# Patient Record
Sex: Male | Born: 1965 | Race: Black or African American | Hispanic: No | Marital: Single | Smoking: Current every day smoker
Health system: Southern US, Community
[De-identification: ages and names within clinical notes are randomized; demographics above are authoritative.]

## PROBLEM LIST (undated history)

## (undated) DIAGNOSIS — H269 Unspecified cataract: Secondary | ICD-10-CM

## (undated) DIAGNOSIS — E559 Vitamin D deficiency, unspecified: Secondary | ICD-10-CM

## (undated) DIAGNOSIS — Z8673 Personal history of transient ischemic attack (TIA), and cerebral infarction without residual deficits: Secondary | ICD-10-CM

## (undated) DIAGNOSIS — E785 Hyperlipidemia, unspecified: Secondary | ICD-10-CM

## (undated) DIAGNOSIS — F32A Depression, unspecified: Secondary | ICD-10-CM

## (undated) DIAGNOSIS — G459 Transient cerebral ischemic attack, unspecified: Secondary | ICD-10-CM

## (undated) DIAGNOSIS — I10 Essential (primary) hypertension: Secondary | ICD-10-CM

## (undated) DIAGNOSIS — F4481 Dissociative identity disorder: Secondary | ICD-10-CM

## (undated) DIAGNOSIS — G4730 Sleep apnea, unspecified: Secondary | ICD-10-CM

## (undated) DIAGNOSIS — R1310 Dysphagia, unspecified: Secondary | ICD-10-CM

## (undated) DIAGNOSIS — H10409 Unspecified chronic conjunctivitis, unspecified eye: Secondary | ICD-10-CM

## (undated) DIAGNOSIS — F329 Major depressive disorder, single episode, unspecified: Secondary | ICD-10-CM

## (undated) DIAGNOSIS — E119 Type 2 diabetes mellitus without complications: Secondary | ICD-10-CM

## (undated) DIAGNOSIS — F209 Schizophrenia, unspecified: Secondary | ICD-10-CM

## (undated) HISTORY — DX: Essential (primary) hypertension: I10

## (undated) HISTORY — DX: Dissociative identity disorder: F44.81

## (undated) HISTORY — DX: Vitamin D deficiency, unspecified: E55.9

## (undated) HISTORY — DX: Personal history of transient ischemic attack (TIA), and cerebral infarction without residual deficits: Z86.73

## (undated) HISTORY — DX: Schizophrenia, unspecified: F20.9

## (undated) HISTORY — DX: Sleep apnea, unspecified: G47.30

## (undated) HISTORY — DX: Dysphagia, unspecified: R13.10

## (undated) HISTORY — DX: Type 2 diabetes mellitus without complications: E11.9

## (undated) HISTORY — DX: Unspecified chronic conjunctivitis, unspecified eye: H10.409

## (undated) HISTORY — DX: Hyperlipidemia, unspecified: E78.5

## (undated) HISTORY — DX: Transient cerebral ischemic attack, unspecified: G45.9

## (undated) HISTORY — DX: Depression, unspecified: F32.A

## (undated) HISTORY — DX: Major depressive disorder, single episode, unspecified: F32.9

## (undated) HISTORY — DX: Unspecified cataract: H26.9

---

## 2017-10-06 ENCOUNTER — Encounter: Payer: Self-pay | Admitting: *Deleted

## 2017-10-07 ENCOUNTER — Ambulatory Visit: Payer: Self-pay | Admitting: Cardiology

## 2017-10-07 ENCOUNTER — Encounter: Payer: Self-pay | Admitting: Cardiology

## 2017-10-07 NOTE — Progress Notes (Deleted)
Cardiology Office Note  Date: 10/07/2017   ID: Montey Ebel, DOB 1966-08-20, MRN [id number]  PCP: No primary care provider on file.  Consulting Cardiologist: Nona Dell, MD   No chief complaint on file.   History of Present Illness: Leslie Snow is a 51 y.o. male referred for cardiology consultation by Ms.Coady NP for preprocedure cardiac assessment prior to dental extractions. Limited information was provided, and I do not have access to any old records. He currently resides at [facility] facility.  Past Medical History:  Diagnosis Date  . Cataract   . Chronic conjunctivitis   . Depression   . Dysphagia   . Essential hypertension   . History of stroke    Right hemiplegia  . Hyperlipidemia   . Multiple personality disorder (HCC)   . Schizophrenia (HCC)   . Sleep apnea   . TIA (transient ischemic attack)   . Type 2 diabetes mellitus (HCC)   . Vitamin D deficiency     No past surgical history on file.  Current Outpatient Prescriptions  Medication Sig Dispense Refill  . acetaminophen (TYLENOL) 325 MG tablet Take 650 mg by mouth every 6 (six) hours as needed.    Marland Kitchen atorvastatin (LIPITOR) 20 MG tablet Take 20 mg by mouth daily.    . carvedilol (COREG) 6.25 MG tablet Take 6.25 mg by mouth 2 (two) times daily with a meal.    . clopidogrel (PLAVIX) 75 MG tablet Take 75 mg by mouth daily.    Marland Kitchen HYDROcodone-acetaminophen (NORCO/VICODIN) 5-325 MG tablet Take 1 tablet by mouth every 6 (six) hours as needed for moderate pain.    . metFORMIN (GLUCOPHAGE) 1000 MG tablet Take 1,000 mg by mouth 2 (two) times daily with a meal.    . QUEtiapine (SEROQUEL) 300 MG tablet Take 300 mg by mouth at bedtime.    . sertraline (ZOLOFT) 100 MG tablet Take 100 mg by mouth daily.     No current facility-administered medications for this visit.    Allergies:  Bee venom and Venomil honey bee venom [honey bee venom]   Social History: The patient     Family History: The patient's  family history is not on file.   ROS:  Please see the history of present illness. Otherwise, complete review of systems is positive for {NONE DEFAULTED:18576::"none"}.  All other systems are reviewed and negative.   Physical Exam: VS:  There were no vitals taken for this visit., BMI There is no height or weight on file to calculate BMI.  Wt Readings from Last 3 Encounters:  09/01/17 245 lb (111.1 kg)    General: Patient appears comfortable at rest. HEENT: Conjunctiva and lids normal, oropharynx clear with moist mucosa. Neck: Supple, no elevated JVP or carotid bruits, no thyromegaly. Lungs: Clear to auscultation, nonlabored breathing at rest. Cardiac: Regular rate and rhythm, no S3 or significant systolic murmur, no pericardial rub. Abdomen: Soft, nontender, no hepatomegaly, bowel sounds present, no guarding or rebound. Extremities: No pitting edema, distal pulses 2+. Skin: Warm and dry. Musculoskeletal: No kyphosis. Neuropsychiatric: Alert and oriented x3, affect grossly appropriate.  ECG: There is no old tracing for comparison.  Recent Labwork:  No recent lab work available for review.  Other Studies Reviewed Today:   Assessment and Plan:    Current medicines were reviewed with the patient today.  No orders of the defined types were placed in this encounter.   Disposition:  Signed, Jonelle Sidle, MD, [facility] 10/07/2017 8:39 AM  [facility] [place], [place], [place] [address] Phone: [phone]; Fax: [phone]

## 2017-10-14 ENCOUNTER — Encounter: Payer: Self-pay | Admitting: *Deleted

## 2017-10-14 ENCOUNTER — Telehealth: Payer: Self-pay | Admitting: Cardiovascular Disease

## 2017-10-14 ENCOUNTER — Ambulatory Visit (INDEPENDENT_AMBULATORY_CARE_PROVIDER_SITE_OTHER): Payer: Medicaid Other | Admitting: Cardiovascular Disease

## 2017-10-14 ENCOUNTER — Encounter: Payer: Self-pay | Admitting: Cardiovascular Disease

## 2017-10-14 VITALS — BP 130/80 | HR 96 | Ht 72.0 in | Wt 245.0 lb

## 2017-10-14 DIAGNOSIS — I6930 Unspecified sequelae of cerebral infarction: Secondary | ICD-10-CM | POA: Diagnosis not present

## 2017-10-14 DIAGNOSIS — Z01818 Encounter for other preprocedural examination: Secondary | ICD-10-CM

## 2017-10-14 DIAGNOSIS — I10 Essential (primary) hypertension: Secondary | ICD-10-CM

## 2017-10-14 DIAGNOSIS — E785 Hyperlipidemia, unspecified: Secondary | ICD-10-CM | POA: Diagnosis not present

## 2017-10-14 DIAGNOSIS — I252 Old myocardial infarction: Secondary | ICD-10-CM

## 2017-10-14 NOTE — Telephone Encounter (Signed)
Pre-cert Verification for the following procedure   Lexiscan scheduled for 10-27-17

## 2017-10-14 NOTE — Patient Instructions (Signed)
Medication Instructions:  Continue all current medications.  Labwork: none  Testing/Procedures:  Your physician has requested that you have a lexiscan myoview. For further information please visit [email or web address]. Please follow instruction sheet, as given.  Office will contact with results via phone or letter.    Follow-Up: To be determined.   Any Other Special Instructions Will Be Listed Below (If Applicable).  If you need a refill on your cardiac medications before your next appointment, please call your pharmacy.  

## 2017-10-14 NOTE — Progress Notes (Signed)
CARDIOLOGY CONSULT NOTE  Patient ID: Leslie Snow MRN: [id number] DOB/AGE: 1966/01/28 51 y.o.  Admit date: (Not on file) Primary Physician: Bernerd Limbo, MD Referring Physician: Bernerd Limbo, MD  Reason for Consultation: Preoperative risk stratification  HPI: Leslie Snow is a 51 y.o. male who is being seen today for the evaluation of preoperative risk stratification at the request of Dr. Eden Emms.  He is being scheduled undergo tooth extraction.  He reportedly has a history of hypertension and CVA. I do not have any significant records to go by. Some records from the nursing home where he resides comment on prior thrombosis of the right iliac vein. There is some mention of angina pectoris and chronic pain syndrome. There is also a comment about atherosclerotic heart disease. Based on a review of his medication list, he has hyperlipidemia and insulin-dependent diabetes mellitus.  I was able to review labs performed on 09/07/17, which demonstrated hemoglobin 11.4, BUN 15, creatinine 1.01, sodium 138, potassium 4, total cholesterol 105, triglycerides 192, HDL 23, LDL 44.  He tells me he had an MI 4-5 years ago in [place]. He does not recall undergoing coronary angiography. He said he did not have chest pain at that time but had shortness of breath. He said he had a CVA in 2010, 2012, and 2014.  He currently denies chest pain and palpitations. He occasionally has shortness of breath.  He has been smoking a half pack of cigarettes daily for the past 42 years.  ECG performed in the office today which I ordered and personally interpreted demonstrates normal sinus rhythm with no ischemic ST segment or T-wave abnormalities, nor any arrhythmias.       Allergies  Allergen Reactions  . Bee Venom   . Venomil Honey Bee Venom [Honey Bee Venom]     Current Outpatient Prescriptions  Medication Sig Dispense Refill  . acetaminophen (TYLENOL) 325 MG tablet Take 650 mg  by mouth every 6 (six) hours as needed.    Marland Kitchen atorvastatin (LIPITOR) 20 MG tablet Take 20 mg by mouth daily.    . baclofen (LIORESAL) 10 MG tablet Take 5 mg by mouth 3 (three) times daily.    . carvedilol (COREG) 6.25 MG tablet Take 6.25 mg by mouth 2 (two) times daily with a meal.    . Cholecalciferol 1000 units TBDP Take by mouth.    . clopidogrel (PLAVIX) 75 MG tablet Take 75 mg by mouth daily.    Marland Kitchen HYDROcodone-acetaminophen (NORCO/VICODIN) 5-325 MG tablet Take 1 tablet by mouth every 6 (six) hours as needed for moderate pain.    . hydroxypropyl methylcellulose / hypromellose (ISOPTO TEARS / GONIOVISC) 2.5 % ophthalmic solution 1 drop.    . insulin glargine (LANTUS) 100 UNIT/ML injection Inject into the skin at bedtime.    . insulin lispro (HUMALOG) 100 UNIT/ML injection Inject into the skin 3 (three) times daily before meals.    Marland Kitchen lisinopril (PRINIVIL,ZESTRIL) 5 MG tablet Take 5 mg by mouth daily.    . QUEtiapine (SEROQUEL) 300 MG tablet Take 300 mg by mouth at bedtime.    Marland Kitchen scopolamine (TRANSDERM-SCOP) 1 MG/3DAYS Place 1 patch onto the skin every 3 (three) days.    Marland Kitchen sertraline (ZOLOFT) 100 MG tablet Take 100 mg by mouth daily.    . sitaGLIPtin (JANUVIA) 100 MG tablet Take 100 mg by mouth daily.    . traZODone (DESYREL) 50 MG tablet Take 50 mg by mouth at bedtime.     No  current facility-administered medications for this visit.     Past Medical History:  Diagnosis Date  . Cataract   . Chronic conjunctivitis   . Depression   . Dysphagia   . Essential hypertension   . History of stroke    Right hemiplegia  . Hyperlipidemia   . Multiple personality disorder (HCC)   . Schizophrenia (HCC)   . Sleep apnea   . TIA (transient ischemic attack)   . Type 2 diabetes mellitus (HCC)   . Vitamin D deficiency     No past surgical history on file.  Social History   Social History  . Marital status: N/A    Spouse name: N/A  . Number of children: N/A  . Years of education: N/A    Occupational History  . Not on file.   Social History Main Topics  . Smoking status: Current Every Day Smoker    Packs/day: 0.50    Types: Cigarettes  . Smokeless tobacco: Never Used  . Alcohol use Not on file  . Drug use: Unknown  . Sexual activity: Not on file   Other Topics Concern  . Not on file   Social History Narrative  . No narrative on file     No family history of premature CAD in 1st degree relatives.  Current Meds  Medication Sig  . acetaminophen (TYLENOL) 325 MG tablet Take 650 mg by mouth every 6 (six) hours as needed.  Marland Kitchen atorvastatin (LIPITOR) 20 MG tablet Take 20 mg by mouth daily.  . baclofen (LIORESAL) 10 MG tablet Take 5 mg by mouth 3 (three) times daily.  . carvedilol (COREG) 6.25 MG tablet Take 6.25 mg by mouth 2 (two) times daily with a meal.  . Cholecalciferol 1000 units TBDP Take by mouth.  . clopidogrel (PLAVIX) 75 MG tablet Take 75 mg by mouth daily.  Marland Kitchen HYDROcodone-acetaminophen (NORCO/VICODIN) 5-325 MG tablet Take 1 tablet by mouth every 6 (six) hours as needed for moderate pain.  . hydroxypropyl methylcellulose / hypromellose (ISOPTO TEARS / GONIOVISC) 2.5 % ophthalmic solution 1 drop.  . insulin glargine (LANTUS) 100 UNIT/ML injection Inject into the skin at bedtime.  . insulin lispro (HUMALOG) 100 UNIT/ML injection Inject into the skin 3 (three) times daily before meals.  Marland Kitchen lisinopril (PRINIVIL,ZESTRIL) 5 MG tablet Take 5 mg by mouth daily.  . QUEtiapine (SEROQUEL) 300 MG tablet Take 300 mg by mouth at bedtime.  Marland Kitchen scopolamine (TRANSDERM-SCOP) 1 MG/3DAYS Place 1 patch onto the skin every 3 (three) days.  Marland Kitchen sertraline (ZOLOFT) 100 MG tablet Take 100 mg by mouth daily.  . sitaGLIPtin (JANUVIA) 100 MG tablet Take 100 mg by mouth daily.  . traZODone (DESYREL) 50 MG tablet Take 50 mg by mouth at bedtime.      Review of systems complete and found to be negative unless listed above in HPI    Physical exam Blood pressure 130/80, pulse 96,  height 6' (1.829 m), weight 245 lb (111.1 kg), SpO2 96 %. General: NAD Neck: No JVD, no thyromegaly or thyroid nodule.  Lungs: Clear to auscultation bilaterally with normal respiratory effort. CV: Nondisplaced PMI. Regular rate and rhythm, normal S1/S2, no S3/S4, no murmur.  No peripheral edema.  No carotid bruit.    Abdomen: Soft, nontender, no distention.  Skin: Intact without lesions or rashes.  Neurologic: Alert and oriented x 3.  Psych: Normal affect. Extremities: No clubbing or cyanosis.  HEENT: Normal.   ECG: Most recent ECG reviewed.   Labs: No results found for:  K, BUN, CREATININE, ALT, TSH, HGB   Lipids: No results found for: LDLCALC, LDLDIRECT, CHOL, TRIG, HDL      ASSESSMENT AND PLAN:  1. Preoperative risk stratification with reported history of MI: He is presently symptomatically stable. ECG is without ischemic abnormalities. He has multiple cardiovascular risk factors. I will proceed with a nuclear myocardial perfusion imaging study to evaluate for ischemic heart disease (Lexiscan Myoview). He is on carvedilol and Lipitor which will help to mitigate perioperative risk of major adverse cardiac events.  2. Hypertension: Controlled. No changes to therapy.  3. Hyperlipidemia: Lipids reviewed above. Continue Lipitor 20 mg.  4. History of multiple CVA's: He is on Plavix and Lipitor.   Disposition: Follow up to be determined.    Signed: Prentice DockerSuresh Caelum Federici, M.D., F.A.C.C.  10/14/2017, 2:59 PM

## 2017-10-27 ENCOUNTER — Encounter (HOSPITAL_COMMUNITY)
Admission: RE | Admit: 2017-10-27 | Discharge: 2017-10-27 | Disposition: A | Discharge status: Home / Self Care | Payer: Medicaid Other | Source: Ambulatory Visit | Attending: Cardiovascular Disease | Admitting: Cardiovascular Disease

## 2017-10-27 ENCOUNTER — Encounter (HOSPITAL_COMMUNITY): Payer: Self-pay

## 2017-10-27 ENCOUNTER — Encounter (HOSPITAL_BASED_OUTPATIENT_CLINIC_OR_DEPARTMENT_OTHER)
Admission: RE | Admit: 2017-10-27 | Discharge: 2017-10-27 | Disposition: A | Discharge status: Home / Self Care | Payer: Medicaid Other | Source: Ambulatory Visit | Attending: Cardiovascular Disease | Admitting: Cardiovascular Disease

## 2017-10-27 DIAGNOSIS — I252 Old myocardial infarction: Secondary | ICD-10-CM | POA: Diagnosis present

## 2017-10-27 DIAGNOSIS — Z01818 Encounter for other preprocedural examination: Secondary | ICD-10-CM | POA: Insufficient documentation

## 2017-10-27 LAB — NM MYOCAR MULTI W/SPECT W/WALL MOTION / EF
CHL CUP NUCLEAR SDS: 1
CHL CUP RESTING HR STRESS: 90 {beats}/min
CSEPPHR: 101 {beats}/min
LHR: 0.4
LV dias vol: 72 mL (ref 62–150)
LVSYSVOL: 30 mL
SRS: 2
SSS: 3
TID: 1.32

## 2017-10-27 MED ORDER — TECHNETIUM TC 99M TETROFOSMIN IV KIT
10.0000 | PACK | Freq: Once | INTRAVENOUS | Status: AC | PRN
  Administered 2017-10-27: 9.3 via INTRAVENOUS

## 2017-10-27 MED ORDER — REGADENOSON 0.4 MG/5ML IV SOLN
INTRAVENOUS | Status: AC
  Administered 2017-10-27: 0.4 mg via INTRAVENOUS
  Filled 2017-10-27: qty 5

## 2017-10-27 MED ORDER — TECHNETIUM TC 99M TETROFOSMIN IV KIT
30.0000 | PACK | Freq: Once | INTRAVENOUS | Status: AC | PRN
  Administered 2017-10-27: 30 via INTRAVENOUS

## 2017-10-27 MED ORDER — SODIUM CHLORIDE 0.9% FLUSH
INTRAVENOUS | Status: AC
  Administered 2017-10-27: 10 mL via INTRAVENOUS
  Filled 2017-10-27: qty 10

## 2017-10-29 ENCOUNTER — Telehealth: Payer: Self-pay | Admitting: *Deleted

## 2017-10-29 NOTE — Telephone Encounter (Signed)
Notes recorded by Lesle ChrisHill, Angela G, LPN on 16/1/096011/01/2017 at 1:48 PM EDT [facility]. Copy to pmd. ------  Notes recorded by Laqueta LindenKoneswaran, Suresh A, MD on 10/27/2017 at 5:09 PM EDT Normal.

## 2017-10-29 NOTE — Telephone Encounter (Signed)
[facility]

## 2017-11-27 ENCOUNTER — Emergency Department (HOSPITAL_COMMUNITY)
Admission: EM | Admit: 2017-11-27 | Discharge: 2017-11-27 | Disposition: A | Discharge status: Home / Self Care | Payer: Medicaid Other | Attending: Emergency Medicine | Admitting: Emergency Medicine

## 2017-11-27 ENCOUNTER — Encounter (HOSPITAL_COMMUNITY): Payer: Self-pay | Admitting: *Deleted

## 2017-11-27 ENCOUNTER — Other Ambulatory Visit: Payer: Self-pay

## 2017-11-27 DIAGNOSIS — F17210 Nicotine dependence, cigarettes, uncomplicated: Secondary | ICD-10-CM | POA: Insufficient documentation

## 2017-11-27 DIAGNOSIS — K1379 Other lesions of oral mucosa: Secondary | ICD-10-CM

## 2017-11-27 DIAGNOSIS — K068 Other specified disorders of gingiva and edentulous alveolar ridge: Secondary | ICD-10-CM | POA: Insufficient documentation

## 2017-11-27 DIAGNOSIS — Z794 Long term (current) use of insulin: Secondary | ICD-10-CM | POA: Diagnosis not present

## 2017-11-27 DIAGNOSIS — K0889 Other specified disorders of teeth and supporting structures: Secondary | ICD-10-CM | POA: Diagnosis present

## 2017-11-27 DIAGNOSIS — Z8673 Personal history of transient ischemic attack (TIA), and cerebral infarction without residual deficits: Secondary | ICD-10-CM | POA: Insufficient documentation

## 2017-11-27 DIAGNOSIS — I10 Essential (primary) hypertension: Secondary | ICD-10-CM | POA: Insufficient documentation

## 2017-11-27 DIAGNOSIS — E119 Type 2 diabetes mellitus without complications: Secondary | ICD-10-CM | POA: Insufficient documentation

## 2017-11-27 DIAGNOSIS — Z79899 Other long term (current) drug therapy: Secondary | ICD-10-CM | POA: Insufficient documentation

## 2017-11-27 DIAGNOSIS — Z7902 Long term (current) use of antithrombotics/antiplatelets: Secondary | ICD-10-CM | POA: Insufficient documentation

## 2017-11-27 LAB — CBC WITH DIFFERENTIAL/PLATELET
BASOS ABS: 0 10*3/uL (ref 0.0–0.1)
Basophils Relative: 0 %
EOS ABS: 1 10*3/uL — AB (ref 0.0–0.7)
EOS PCT: 8 %
HCT: 39 % (ref 39.0–52.0)
HEMOGLOBIN: 11.9 g/dL — AB (ref 13.0–17.0)
LYMPHS ABS: 1.4 10*3/uL (ref 0.7–4.0)
LYMPHS PCT: 11 %
MCH: 27.1 pg (ref 26.0–34.0)
MCHC: 30.5 g/dL (ref 30.0–36.0)
MCV: 88.8 fL (ref 78.0–100.0)
Monocytes Absolute: 1 10*3/uL (ref 0.1–1.0)
Monocytes Relative: 8 %
NEUTROS PCT: 73 %
Neutro Abs: 8.9 10*3/uL — ABNORMAL HIGH (ref 1.7–7.7)
PLATELETS: 177 10*3/uL (ref 150–400)
RBC: 4.39 MIL/uL (ref 4.22–5.81)
RDW: 16.7 % — ABNORMAL HIGH (ref 11.5–15.5)
WBC: 12.3 10*3/uL — AB (ref 4.0–10.5)

## 2017-11-27 NOTE — ED Notes (Addendum)
[facility] [phone]  [facility]   Note: discussed O2 sat decrease and putting O2/2l on while here to maintain pulse ox and suggested that she speak with provided regarding since he uses CPAP at night, but naps during the day

## 2017-11-27 NOTE — ED Notes (Signed)
[facility] here to transport  °

## 2017-11-27 NOTE — ED Notes (Signed)
Awaiting transport.

## 2017-11-27 NOTE — ED Notes (Signed)
Resting, eyes closed, [facility]

## 2017-11-27 NOTE — ED Notes (Signed)
Resting with eyes closed   Leslie Snow is delayed due to unavailable transport at this time

## 2017-11-27 NOTE — ED Provider Notes (Signed)
[facility] EMERGENCY DEPARTMENT Provider Note   CSN: [id number] Arrival date & time: 11/27/17  [id number]     History   Chief Complaint Chief Complaint  Patient presents with  . Coagulation Disorder    HPI Leslie Snow is a 51 y.o. male.  The history is provided by the patient. No language interpreter was used.  Dental Pain   This is a new problem. The current episode started more than 1 week ago. The problem occurs constantly. The problem has not changed since onset.The pain is moderate. He has tried nothing for the symptoms. The treatment provided no relief.   Pt reports he had teeth removed.  Pt has had gum bleeding today.  no current bleeding Past Medical History:  Diagnosis Date  . Cataract   . Chronic conjunctivitis   . Depression   . Dysphagia   . Essential hypertension   . History of stroke    Right hemiplegia  . Hyperlipidemia   . Multiple personality disorder (HCC)   . Schizophrenia (HCC)   . Sleep apnea   . TIA (transient ischemic attack)   . Type 2 diabetes mellitus (HCC)   . Vitamin D deficiency     There are no active problems to display for this patient.   History reviewed. No pertinent surgical history.     Home Medications    Prior to Admission medications   Medication Sig Start Date End Date Taking? Authorizing Provider  acetaminophen (TYLENOL) 325 MG tablet Take 650 mg by mouth every 6 (six) hours as needed.    [provider]  atorvastatin (LIPITOR) 20 MG tablet Take 20 mg by mouth daily.    [provider]  baclofen (LIORESAL) 10 MG tablet Take 5 mg by mouth 3 (three) times daily.    [provider]  carvedilol (COREG) 6.25 MG tablet Take 6.25 mg by mouth 2 (two) times daily with a meal.    [provider]  Cholecalciferol 1000 units TBDP Take by mouth.    [provider]  clopidogrel (PLAVIX) 75 MG tablet Take 75 mg by mouth daily.    [provider]  HYDROcodone-acetaminophen  (NORCO/VICODIN) 5-325 MG tablet Take 1 tablet by mouth every 6 (six) hours as needed for moderate pain.    [provider]  hydroxypropyl methylcellulose / hypromellose (ISOPTO TEARS / GONIOVISC) 2.5 % ophthalmic solution 1 drop.    [provider]  insulin glargine (LANTUS) 100 UNIT/ML injection Inject into the skin at bedtime.    [provider]  insulin lispro (HUMALOG) 100 UNIT/ML injection Inject into the skin 3 (three) times daily before meals.    [provider]  lisinopril (PRINIVIL,ZESTRIL) 5 MG tablet Take 5 mg by mouth daily.    [provider]  QUEtiapine (SEROQUEL) 300 MG tablet Take 300 mg by mouth at bedtime.    [provider]  scopolamine (TRANSDERM-SCOP) 1 MG/3DAYS Place 1 patch onto the skin every 3 (three) days.    [provider]  sertraline (ZOLOFT) 100 MG tablet Take 100 mg by mouth daily.    [provider]  sitaGLIPtin (JANUVIA) 100 MG tablet Take 100 mg by mouth daily.    [provider]  traZODone (DESYREL) 50 MG tablet Take 50 mg by mouth at bedtime.    [provider]    Family History History reviewed. No pertinent family history.  Social History Social History   Tobacco Use  . Smoking status: Current Every Day Smoker  Packs/day: 0.50    Types: Cigarettes  . Smokeless tobacco: Never Used  Substance Use Topics  . Alcohol use: Not on file  . Drug use: Not on file     Allergies   Bee venom and Venomil honey bee venom [honey bee venom]   Review of Systems Review of Systems  All other systems reviewed and are negative.    Physical Exam Updated Vital Signs BP 108/83 (BP Location: Left Arm)   Pulse 97   Temp 97.9 F (36.6 C) (Axillary)   Resp 16   Wt 117 kg (258 lb)   SpO2 92%   BMI 34.99 kg/m   Physical Exam  Constitutional: He appears well-developed and well-nourished.  HENT:  Head: Normocephalic.  Dried blood lips,  Clot on left lower posterior  gum   Eyes: Pupils are equal, round, and reactive to light.  Neck: Normal range of motion.  Cardiovascular: Normal rate.  Pulmonary/Chest: Effort normal.  Neurological: He is alert.  Skin: Skin is warm.  Psychiatric: He has a normal mood and affect.  Nursing note and vitals reviewed.    ED Treatments / Results  Labs (all labs ordered are listed, but only abnormal results are displayed) Labs Reviewed - No data to display  EKG  EKG Interpretation None       Radiology No results found.  Procedures Procedures (including critical care time)  Medications Ordered in ED Medications - No data to display   Initial Impression / Assessment and Plan / ED Course  I have reviewed the triage vital signs and the nursing notes.  Pertinent labs & imaging results that were available during my care of the patient were reviewed by me and considered in my medical decision making (see chart for details).    CBC normal.  Pt observed an extended period of time.  No further bleeding.     Final Clinical Impressions(s) / ED Diagnoses   Final diagnoses:  Oral bleeding    ED Discharge Orders    None    An After Visit Summary was printed and given to the patient. I advised apply pressure if any further bleeding   Elson AreasSofia, Elsy Chiang K, PA-C 11/27/17 1223    Marily MemosMesner, Jason, MD 11/27/17 1330

## 2017-11-27 NOTE — Discharge Instructions (Signed)
Return if any problems.

## 2017-11-27 NOTE — ED Notes (Signed)
Call to [facility] for return transport

## 2017-11-27 NOTE — ED Triage Notes (Signed)
[facility] that started [facility] night; pt had a few teeth removed last week; dried blood noted to lips and some blood in mouth; pt denies any pain at this time

## 2017-11-27 NOTE — ED Notes (Signed)
[facility]

## 2017-11-27 NOTE — ED Notes (Signed)
Pt resting- O2 sat noted to drop from 92 per cent RA to 80 per cent RA  In a regular manner  Inhalation 80-92 per cent on room air Exhalation 92-80 per cent on room air  Pt reports that he has sleep apnea    O2 /2L [place] applied-

## 2019-08-26 IMAGING — NM NM MYOCAR MULTI W/SPECT W/WALL MOTION & EF
2 series · 12 of 12 positions shown · non-contrast
Comparison: none

[Series 1: rest · 6.51mm/px · 6 of 64 frames shown]
[frame 6/64]
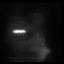
[frame 16/64]
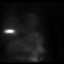
[frame 27/64]
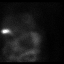
[frame 38/64]
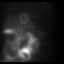
[frame 48/64]
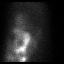
[frame 59/64]
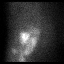

[Series 2: stress gated · 6.51mm/px · 6 of 64 frames shown]
[frame 6/64]
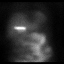
[frame 16/64]
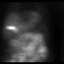
[frame 27/64]
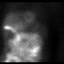
[frame 38/64]
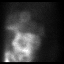
[frame 48/64]
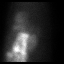
[frame 59/64]
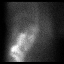

[12 of 12 positions shown; findings below may reference images not displayed]

Canned report from images found in remote index.

Refer to host system for actual result text.
